# Patient Record
Sex: Male | Born: 1980 | Race: White | Hispanic: No | Marital: Single | Smoking: Current every day smoker
Health system: Southern US, Community
[De-identification: ages and names within clinical notes are randomized; demographics above are authoritative.]

## PROBLEM LIST (undated history)

## (undated) DIAGNOSIS — M109 Gout, unspecified: Secondary | ICD-10-CM

## (undated) DIAGNOSIS — I10 Essential (primary) hypertension: Secondary | ICD-10-CM

## (undated) DIAGNOSIS — N200 Calculus of kidney: Secondary | ICD-10-CM

---

## 2013-05-26 ENCOUNTER — Emergency Department: Payer: Self-pay | Admitting: Internal Medicine

## 2013-07-24 ENCOUNTER — Emergency Department: Payer: Self-pay | Admitting: Emergency Medicine

## 2013-07-24 LAB — URINALYSIS, COMPLETE
Bilirubin,UR: NEGATIVE
GLUCOSE, UR: NEGATIVE mg/dL (ref 0–75)
KETONE: NEGATIVE
Leukocyte Esterase: NEGATIVE
NITRITE: NEGATIVE
PH: 6 (ref 4.5–8.0)
Protein: NEGATIVE
RBC,UR: 13 /HPF (ref 0–5)
Specific Gravity: 1.012 (ref 1.003–1.030)
Squamous Epithelial: NONE SEEN

## 2013-07-24 LAB — CBC
HCT: 55.4 % — AB (ref 40.0–52.0)
HGB: 18.4 g/dL — ABNORMAL HIGH (ref 13.0–18.0)
MCH: 31.3 pg (ref 26.0–34.0)
MCHC: 33.2 g/dL (ref 32.0–36.0)
MCV: 95 fL (ref 80–100)
PLATELETS: 258 10*3/uL (ref 150–440)
RBC: 5.86 10*6/uL (ref 4.40–5.90)
RDW: 13.3 % (ref 11.5–14.5)
WBC: 14.4 10*3/uL — AB (ref 3.8–10.6)

## 2013-07-25 LAB — COMPREHENSIVE METABOLIC PANEL
ALBUMIN: 4.1 g/dL (ref 3.4–5.0)
ALK PHOS: 77 U/L
ALT: 34 U/L (ref 12–78)
AST: 18 U/L (ref 15–37)
Anion Gap: 4 — ABNORMAL LOW (ref 7–16)
BUN: 22 mg/dL — AB (ref 7–18)
Bilirubin,Total: 0.3 mg/dL (ref 0.2–1.0)
CALCIUM: 9.1 mg/dL (ref 8.5–10.1)
CREATININE: 1.28 mg/dL (ref 0.60–1.30)
Chloride: 104 mmol/L (ref 98–107)
Co2: 29 mmol/L (ref 21–32)
EGFR (African American): >60
EGFR (Non-African Amer.): >60
Glucose: 94 mg/dL (ref 65–99)
OSMOLALITY: 277 (ref 275–301)
Potassium: 4.4 mmol/L (ref 3.5–5.1)
SODIUM: 137 mmol/L (ref 136–145)
TOTAL PROTEIN: 7 g/dL (ref 6.4–8.2)

## 2014-03-29 ENCOUNTER — Emergency Department: Payer: Self-pay | Admitting: Student

## 2014-06-21 ENCOUNTER — Emergency Department: Payer: Self-pay

## 2014-06-21 ENCOUNTER — Emergency Department
Admission: EM | Admit: 2014-06-21 | Discharge: 2014-06-21 | Disposition: A | Discharge status: Home / Self Care | Payer: Self-pay | Attending: Emergency Medicine | Admitting: Emergency Medicine

## 2014-06-21 DIAGNOSIS — Z720 Tobacco use: Secondary | ICD-10-CM | POA: Insufficient documentation

## 2014-06-21 DIAGNOSIS — I10 Essential (primary) hypertension: Secondary | ICD-10-CM | POA: Insufficient documentation

## 2014-06-21 DIAGNOSIS — N200 Calculus of kidney: Secondary | ICD-10-CM | POA: Insufficient documentation

## 2014-06-21 HISTORY — DX: Calculus of kidney: N20.0

## 2014-06-21 HISTORY — DX: Essential (primary) hypertension: I10

## 2014-06-21 HISTORY — DX: Gout, unspecified: M10.9

## 2014-06-21 LAB — URINALYSIS COMPLETE WITH MICROSCOPIC (ARMC ONLY)
BACTERIA UA: NONE SEEN
Bilirubin Urine: NEGATIVE
Glucose, UA: NEGATIVE mg/dL
KETONES UR: NEGATIVE mg/dL
LEUKOCYTES UA: NEGATIVE
Nitrite: NEGATIVE
PROTEIN: NEGATIVE mg/dL
Specific Gravity, Urine: 1.008 (ref 1.005–1.030)
pH: 9 — ABNORMAL HIGH (ref 5.0–8.0)

## 2014-06-21 MED ORDER — TAMSULOSIN HCL 0.4 MG PO CAPS: 0.4000 mg | ORAL_CAPSULE | Freq: Every day | ORAL | Status: AC

## 2014-06-21 MED ORDER — OXYCODONE-ACETAMINOPHEN 5-325 MG PO TABS
2.0000 | ORAL_TABLET | Freq: Once | ORAL | Status: AC
  Administered 2014-06-21: 2 via ORAL

## 2014-06-21 MED ORDER — PROMETHAZINE HCL 25 MG/ML IJ SOLN
25.0000 mg | Freq: Once | INTRAMUSCULAR | Status: AC
  Administered 2014-06-21: 25 mg via INTRAMUSCULAR

## 2014-06-21 MED ORDER — IBUPROFEN 800 MG PO TABS: 800.0000 mg | ORAL_TABLET | Freq: Three times a day (TID) | ORAL | Status: AC | PRN

## 2014-06-21 MED ORDER — OXYCODONE-ACETAMINOPHEN 5-325 MG PO TABS: 1.0000 | ORAL_TABLET | ORAL | Status: DC | PRN

## 2014-06-21 MED ORDER — PROMETHAZINE HCL 25 MG PO TABS: 25.0000 mg | ORAL_TABLET | Freq: Four times a day (QID) | ORAL | Status: AC | PRN

## 2014-06-21 MED ORDER — MEPERIDINE HCL 50 MG/ML IJ SOLN
INTRAMUSCULAR | Status: AC
  Administered 2014-06-21: 50 mg via INTRAMUSCULAR
  Filled 2014-06-21: qty 1

## 2014-06-21 MED ORDER — OXYCODONE-ACETAMINOPHEN 5-325 MG PO TABS
ORAL_TABLET | ORAL | Status: AC
  Filled 2014-06-21: qty 2

## 2014-06-21 MED ORDER — PROMETHAZINE HCL 25 MG/ML IJ SOLN
INTRAMUSCULAR | Status: AC
  Administered 2014-06-21: 25 mg via INTRAMUSCULAR
  Filled 2014-06-21: qty 1

## 2014-06-21 MED ORDER — MEPERIDINE HCL 50 MG/ML IJ SOLN
50.0000 mg | Freq: Once | INTRAMUSCULAR | Status: AC
  Administered 2014-06-21: 50 mg via INTRAMUSCULAR

## 2014-06-21 NOTE — ED Notes (Signed)
C/o bilateral flank pain, right sided lower abdominal pain X 3 days. Dark urine present. Hx of kidney stones.

## 2014-06-21 NOTE — ED Provider Notes (Signed)
[facility] Emergency Department Provider Note  ____________________________________________  Time seen: Approximately 11:29 AM  I have reviewed the triage vital signs and the nursing notes.   HISTORY  Chief Complaint Back Pain   HPI Oscar Bridges is a 34 y.o. male sense of complaints of a history of bilateral lower back pain radiating to the right flank and abdomen. Progressively getting worse tonight dysuria but has noticed blood in his urine. Asked medical history significant for kidney stones. Rates pain as 11/10.  Past Medical History  Diagnosis Date  . Kidney stones   . Hypertension   . Gout     There are no active problems to display for this patient.   History reviewed. No pertinent past surgical history.  Current Outpatient Rx  Name  Route  Sig  Dispense  Refill  . ibuprofen (ADVIL,MOTRIN) 800 MG tablet   Oral   Take 1 tablet (800 mg total) by mouth every 8 (eight) hours as needed.   30 tablet   0   . oxyCODONE-acetaminophen (ROXICET) 5-325 MG per tablet   Oral   Take 1-2 tablets by mouth every 4 (four) hours as needed for severe pain.   30 tablet   0   . promethazine (PHENERGAN) 25 MG tablet   Oral   Take 1 tablet (25 mg total) by mouth every 6 (six) hours as needed for nausea or vomiting.   30 tablet   0   . tamsulosin (FLOMAX) 0.4 MG CAPS capsule   Oral   Take 1 capsule (0.4 mg total) by mouth daily.   30 capsule   0     Allergies Amoxicillin; Lithium; Morphine and related; Penicillins; Toradol; Tramadol; and Zoloft  No family history on file.  Social History History  Substance Use Topics  . Smoking status: Current Every Day Smoker  . Smokeless tobacco: Not on file  . Alcohol Use: No    Review of Systems Constitutional: No fever/chills Eyes: No visual changes. ENT: No sore throat. Cardiovascular: Denies chest pain. Respiratory: Denies shortness of breath. Gastrointestinal: No abdominal pain.  No nausea,  no vomiting.  No diarrhea.  No constipation. Genitourinary: Negative for dysuria. Positive for bloody urine. Musculoskeletal: Positive for bilateral low back pain. Skin: Negative for rash. Neurological: Negative for headaches, focal weakness or numbness.  10-point ROS otherwise negative.  ____________________________________________   PHYSICAL EXAM:  VITAL SIGNS: ED Triage Vitals  Enc Vitals Group     BP 06/21/14 1121 162/107 mmHg     Pulse Rate 06/21/14 1121 88     Resp 06/21/14 1121 20     Temp 06/21/14 1121 97.6 F (36.4 C)     Temp Source 06/21/14 1121 Oral     SpO2 06/21/14 1121 98 %     Weight 06/21/14 1121 230 lb (104.327 kg)     Height 06/21/14 1121 5\' 7"  (1.702 m)     Head Cir --      Peak Flow --      Pain Score --      Pain Loc --      Pain Edu? --      Excl. in GC? --     Constitutional: Alert and oriented. Well appearing and in no acute distress. Eyes: Conjunctivae are normal. PERRL. EOMI. Head: Atraumatic. Nose: No congestion/rhinnorhea. Mouth/Throat: Mucous membranes are moist.  Oropharynx non-erythematous. Neck: No stridor.   Cardiovascular: Normal rate, regular rhythm. Grossly normal heart sounds.  Good peripheral circulation. Respiratory: Normal respiratory effort.  No retractions. Lungs CTAB. Gastrointestinal: Soft and nontender. No distention. No abdominal bruits. No CVA tenderness. Genitourinary: Positive suprapubic pain negative discharge. Musculoskeletal: No lower extremity tenderness nor edema.  No joint effusions. Neurologic:  Normal speech and language. No gross focal neurologic deficits are appreciated. Speech is normal. No gait instability. Skin:  Skin is warm, dry and intact. No rash noted. Psychiatric: Mood and affect are normal. Speech and behavior are normal.  ____________________________________________   LABS (all labs ordered are listed, but only abnormal results are displayed)  Labs Reviewed  URINALYSIS COMPLETEWITH  MICROSCOPIC [facility])  - Abnormal; Notable for the following:    Color, Urine STRAW (*)    APPearance CLEAR (*)    Hgb urine dipstick 3+ (*)    pH 9.0 (*)    Squamous Epithelial / LPF 0-5 (*)    All other components within normal limits   ____________________________________________  EKG  none ____________________________________________  RADIOLOGY    IMPRESSION: BILATERAL nephrolithiasis. 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 Cholelithiasis.   Partially obstructing stone in the proximal to mid LEFT ureter, 4 mm in diameter. Mild hydronephrosis and hydroureter on the LEFT. This represents a change from the previous CT in 2015.   Otherwise unremarkable noncontrast study. _________________________________________   PROCEDURES  Procedure(s) performed: None  Critical Care performed: No  ____________________________________________   INITIAL IMPRESSION / ASSESSMENT AND PLAN / ED COURSE  Pertinent labs & imaging results that were available during my care of the patient were reviewed by me and considered in my medical decision making (see chart for details).  Spoke with Dr. Haynes Bast, Urology on call. He agrees with the discharge plan. Patient will follow up with [facility] urology and see Dr. Vanna Scotland. Rx given for Percocet, Flomax 0.4 mg, Phenergan 25 mg, ibuprofen 800. She was sent home with a copy of his CT scan. IV discharged patient feeling much better from an injection of Demerol and Phenergan which she received upon arrival. He voices no other emergency medical complaints at this time. He will return if symptoms worsen. ____________________________________________   FINAL CLINICAL IMPRESSION(S) / ED DIAGNOSES  Final diagnoses:  Kidney stones      Evangeline Dakin, PA-C 06/21/14 1312  Phineas Semen, MD 06/21/14 1348

## 2014-06-21 NOTE — Discharge Instructions (Signed)

## 2014-06-21 NOTE — ED Notes (Signed)
Pt alert and oriented X4, active, cooperative, pt in NAD. RR even and unlabored, color WNL.  Pt informed to return if any life threatening symptoms occur.  Pt left with friend who drove him here from work.

## 2014-07-04 ENCOUNTER — Emergency Department
Admission: EM | Admit: 2014-07-04 | Discharge: 2014-07-04 | Disposition: A | Discharge status: Home / Self Care | Payer: Self-pay | Attending: Emergency Medicine | Admitting: Emergency Medicine

## 2014-07-04 ENCOUNTER — Encounter: Payer: Self-pay | Admitting: Emergency Medicine

## 2014-07-04 ENCOUNTER — Emergency Department: Payer: Self-pay

## 2014-07-04 DIAGNOSIS — I10 Essential (primary) hypertension: Secondary | ICD-10-CM | POA: Insufficient documentation

## 2014-07-04 DIAGNOSIS — R109 Unspecified abdominal pain: Secondary | ICD-10-CM

## 2014-07-04 DIAGNOSIS — Z880 Allergy status to penicillin: Secondary | ICD-10-CM | POA: Insufficient documentation

## 2014-07-04 DIAGNOSIS — N200 Calculus of kidney: Secondary | ICD-10-CM | POA: Insufficient documentation

## 2014-07-04 DIAGNOSIS — Z720 Tobacco use: Secondary | ICD-10-CM | POA: Insufficient documentation

## 2014-07-04 DIAGNOSIS — Z79899 Other long term (current) drug therapy: Secondary | ICD-10-CM | POA: Insufficient documentation

## 2014-07-04 LAB — CBC
HEMATOCRIT: 46.2 % (ref 40.0–52.0)
Hemoglobin: 15.7 g/dL (ref 13.0–18.0)
MCH: 31.7 pg (ref 26.0–34.0)
MCHC: 34 g/dL (ref 32.0–36.0)
MCV: 93.4 fL (ref 80.0–100.0)
PLATELETS: 235 10*3/uL (ref 150–440)
RBC: 4.94 MIL/uL (ref 4.40–5.90)
RDW: 13 % (ref 11.5–14.5)
WBC: 9.9 10*3/uL (ref 3.8–10.6)

## 2014-07-04 LAB — URINALYSIS COMPLETE WITH MICROSCOPIC (ARMC ONLY)
BACTERIA UA: NONE SEEN
BILIRUBIN URINE: NEGATIVE
Glucose, UA: NEGATIVE mg/dL
Ketones, ur: NEGATIVE mg/dL
LEUKOCYTES UA: NEGATIVE
Nitrite: NEGATIVE
Protein, ur: NEGATIVE mg/dL
Specific Gravity, Urine: 1.006 (ref 1.005–1.030)
pH: 9 — ABNORMAL HIGH (ref 5.0–8.0)

## 2014-07-04 LAB — BASIC METABOLIC PANEL
Anion gap: 6 (ref 5–15)
BUN: 15 mg/dL (ref 6–20)
CALCIUM: 8.9 mg/dL (ref 8.9–10.3)
CHLORIDE: 109 mmol/L (ref 101–111)
CO2: 25 mmol/L (ref 22–32)
CREATININE: 0.94 mg/dL (ref 0.61–1.24)
Glucose, Bld: 101 mg/dL — ABNORMAL HIGH (ref 65–99)
Potassium: 4.1 mmol/L (ref 3.5–5.1)
SODIUM: 140 mmol/L (ref 135–145)

## 2014-07-04 MED ORDER — HYDROMORPHONE HCL 1 MG/ML IJ SOLN
INTRAMUSCULAR | Status: AC
  Administered 2014-07-04: 1 mg via INTRAVENOUS
  Filled 2014-07-04: qty 1

## 2014-07-04 MED ORDER — ONDANSETRON HCL 4 MG/2ML IJ SOLN
INTRAMUSCULAR | Status: AC
  Administered 2014-07-04: 4 mg via INTRAVENOUS
  Filled 2014-07-04: qty 2

## 2014-07-04 MED ORDER — SODIUM CHLORIDE 0.9 % IV BOLUS (SEPSIS)
1000.0000 mL | Freq: Once | INTRAVENOUS | Status: AC
  Administered 2014-07-04: 1000 mL via INTRAVENOUS

## 2014-07-04 MED ORDER — ONDANSETRON HCL 4 MG/2ML IJ SOLN
4.0000 mg | INTRAMUSCULAR | Status: AC
  Administered 2014-07-04: 4 mg via INTRAVENOUS

## 2014-07-04 MED ORDER — OXYCODONE-ACETAMINOPHEN 5-325 MG PO TABS: 1.0000 | ORAL_TABLET | Freq: Four times a day (QID) | ORAL | Status: AC | PRN

## 2014-07-04 MED ORDER — HYDROMORPHONE HCL 1 MG/ML IJ SOLN
1.0000 mg | INTRAMUSCULAR | Status: AC
  Administered 2014-07-04: 1 mg via INTRAVENOUS

## 2014-07-04 NOTE — ED Notes (Signed)
Patient transported to X-ray 

## 2014-07-04 NOTE — ED Provider Notes (Signed)
[facility] Emergency Department Provider Note ____________________________________________  Time seen: Approximately 9:02 AM  I have reviewed the triage vital signs and the nursing notes.   HISTORY  Chief Complaint Flank Pain    HPI Oscar Bridges is a 34 y.o. male history of previous kidney stones. Patient states that last night he noticed that he was having difficulty urinating, then developed blood in his urine, and is now having pain in both his left flank as well as his right. He went home with recent diagnosis of kidney stones and believes he passed a stone a couple of days ago which improved the pain on left side, but he still has achiness in the left lower quadrant. In addition, last night he developed severe right flank pain which she states feels like he is passing a kidney stone on the right now as well.  Patient states he has a long history of kidney stones. He last had lithotripsy performed around 6 months ago while in [place] at [facility]. States his pain last night came on suddenly. Severity is currently a 10 out of 10. Sharp pain mostly in the right flank. Denies pain in his groin or testicles. He denies any fevers or chills. He has vomited a couple of times due to "pain".   Past Medical History  Diagnosis Date  . Kidney stones   . Hypertension   . Gout     There are no active problems to display for this patient.   No past surgical history on file.  Current Outpatient Rx  Name  Route  Sig  Dispense  Refill  . ibuprofen (ADVIL,MOTRIN) 800 MG tablet   Oral   Take 1 tablet (800 mg total) by mouth every 8 (eight) hours as needed.   30 tablet   0   . oxyCODONE-acetaminophen (ROXICET) 5-325 MG per tablet   Oral   Take 1-2 tablets by mouth every 4 (four) hours as needed for severe pain.   30 tablet   0   . promethazine (PHENERGAN) 25 MG tablet   Oral   Take 1 tablet (25 mg total) by mouth every 6 (six) hours as needed for  nausea or vomiting.   30 tablet   0   . tamsulosin (FLOMAX) 0.4 MG CAPS capsule   Oral   Take 1 capsule (0.4 mg total) by mouth daily.   30 capsule   0     Allergies Amoxicillin; Lithium; Morphine and related; Penicillins; Toradol; Tramadol; and Zoloft  No family history on file.  Social History History  Substance Use Topics  . Smoking status: Current Every Day Smoker  . Smokeless tobacco: Not on file  . Alcohol Use: No    Review of Systems Constitutional: No fever/chills Eyes: No visual changes. ENT: No sore throat. Cardiovascular: Denies chest pain. Respiratory: Denies shortness of breath. Gastrointestinal: See history of present illness No diarrhea.  No constipation. Genitourinary: Negative for dysuria. Does have some hesitancy with attempting to urinate, also noticing blood in his urine for the last day. Musculoskeletal: Negative for back pain. Skin: Negative for rash. Neurological: Negative for headaches, focal weakness or numbness.  10-point ROS otherwise negative.  ____________________________________________   PHYSICAL EXAM:  VITAL SIGNS: ED Triage Vitals  Enc Vitals Group     BP 07/04/14 0837 130/112 mmHg     Pulse Rate 07/04/14 0837 84     Resp 07/04/14 0837 20     Temp 07/04/14 0837 98.2 F (36.8 C)  Temp Source 07/04/14 0837 Oral     SpO2 07/04/14 0837 95 %     Weight 07/04/14 0837 230 lb (104.327 kg)     Height 07/04/14 0837  (1.702 m)     Head Cir --      Peak Flow --      Pain Score 07/04/14 0838 10     Pain Loc --      Pain Edu? --      Excl. in GC? --     Constitutional: Alert and oriented. Well developed, but appears in acute pain laying on the bed unable to comfortable. Eyes: Conjunctivae are normal. PERRL. EOMI. Head: Atraumatic. Nose: No congestion/rhinnorhea. Mouth/Throat: Mucous membranes are moist.  Oropharynx non-erythematous. Neck: No stridor.   Cardiovascular: Normal rate, regular rhythm. Grossly normal heart  sounds.  Good peripheral circulation. Respiratory: Normal respiratory effort.  No retractions. Lungs CTAB. Gastrointestinal: Soft and nontender with exception of mild tenderness in the right flank and right-sided CVA tenderness. There is no pain at McBurney's point. There is no pain with psoas. No distention. No abdominal bruits. Mild tenderness of the left CVA as well.  No groin pain or masses. Bilateral nontender descended testicles. Musculoskeletal: No lower extremity tenderness nor edema.  No joint effusions. Neurologic:  Normal speech and language. No gross focal neurologic deficits are appreciated. Speech is normal.  Skin:  Skin is warm, dry and intact. No rash noted. Psychiatric: Mood and affect are normal. Speech and behavior are normal.  ____________________________________________   LABS (all labs ordered are listed, but only abnormal results are displayed)  Labs Reviewed  CBC  BASIC METABOLIC PANEL  URINALYSIS COMPLETEWITH MICROSCOPIC ([facility] ONLY)   ____________________________________________  EKG   ____________________________________________  RADIOLOGY  Bladder:  Appears normal for degree of bladder distention. Bilateral ureteral jets were visualized.  IMPRESSION: 1. Bilateral nonobstructing nephrolithiasis. 2. 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   Electronically Signed By: Sebastian Ache On: 07/04/2014 10:01          DG Abd 1 View (Final result) Result time: 07/04/14 09:25:11   Final result by Rad Results In Interface (07/04/14 09:25:11)   Narrative:   CLINICAL DATA: Back pain and right-sided abdominal pain. History of prior renal calculi and lithotripsy.  EXAM: ABDOMEN - 1 VIEW  COMPARISON: CT of the abdomen and pelvis without contrast on 06/21/2014  FINDINGS: Some of the small calculi visualized by CT in the left kidney are identified by x-ray and show similar positioning. Small nonobstructing calculi in the right  kidney seen by CT are not visible. No definite abnormal calcifications seen along the expected courses of the ureters. No abnormal calcifications are seen overlying the bladder. No evidence of bowel obstruction. Bony structures are within normal limits.  IMPRESSION: Small left renal calculi visible by x-ray. Small right renal calculi seen previously by CT are not visible by x-ray. No definite calcifications seen along the expected course of the ureters or overlying the bladder.    ____________________________________________   PROCEDURES  Procedure(s) performed: None  Critical Care performed: No  ____________________________________________   INITIAL IMPRESSION / ASSESSMENT AND PLAN / ED COURSE  Pertinent labs & imaging results that were available during my care of the patient were reviewed by me and considered in my medical decision making (see chart for details).  Patient presenting with bilateral flank pain, now worse on the right with a history of recent kidney stone and ureteral lithiasis on the left. At this point based  on his symptoms my primary concern would be waiting for bilateral hydronephrosis. The patient may be passing bilateral kidney stones at this point, though based on his symptoms is possible that his left-sided kidney stone did resolve previously. He is afebrile and demonstrates no focal tenderness in the right lower quadrant. He has no Murphy sign. He has no pain at McBurney's point.  We will proceed with x-ray and ultrasound imaging to evaluate for bilateral stones and/or hydronephrosis. Pain control and antiemetics. Hydration.  ----------------------------------------- 12:15 PM on 07/04/2014 -----------------------------------------  Patient appears improved. He is currently resting comfortably. No distress. I reviewed ultrasound and x-ray, as well as laboratory analysis with Dr.Dahlstead the on-call physician for urology. At this point, there is no  indication of bilateral obstructing Hydro. This on the patient's symptoms and history it appears that this is most compatible with probably passing a another stone, too small to be seen on KUB in the right. He does not have any evidence of acute infection or pain at McBurney's point. I doubt appendicitis.  We will discharge the patient to follow-up. Dr. Janifer Adieahlstead noted that Dr. [facility]ana [facility] clinic should be able to provide follow-up with the patient tomorrow if the patient calls to set up an appointment.  Because the patient has had 2 recent visits to the ER, I will give him a one-day prescription for pain control. He currently does not have any opiod medicine and does not take any benzodiazepine's. He will have his coworker drive him home. I discussed with him no driving and safe use of Percocet.  Discharge to home. Condition much improved. ____________________________________________   FINAL CLINICAL IMPRESSION(S) / ED DIAGNOSES  Final diagnoses:  Left flank pain   nephrolithiasis, right, initial, acute    Sharyn CreamerMark Courtany Mcmurphy, MD 07/04/14 1221

## 2014-07-04 NOTE — Discharge Instructions (Signed)
Kidney Stones  Please call Dr. Delana [facility]Br[facility]'s clinic today. Follow-up with them tomorrow. I spoken with our on-call urologist, Dr.Dahlbert, who advised that he should be able to be seen in the clinic tomorrow.  No driving. Use Percocet only as prescribed. Do not mix with any alcohol or other strong pain medicines.  Kidney stones (urolithiasis) are deposits that form inside your kidneys. The intense pain is caused by the stone moving through the urinary tract. When the stone moves, the ureter goes into spasm around the stone. The stone is usually passed in the urine.  CAUSES   A disorder that makes certain neck glands produce too much parathyroid hormone (primary hyperparathyroidism).  A buildup of uric acid crystals, similar to gout in your joints.  Narrowing (stricture) of the ureter.  A kidney obstruction present at birth (congenital obstruction).  Previous surgery on the kidney or ureters.  Numerous kidney infections. SYMPTOMS   Feeling sick to your stomach (nauseous).  Throwing up (vomiting).  Blood in the urine (hematuria).  Pain that usually spreads (radiates) to the groin.  Frequency or urgency of urination. DIAGNOSIS   Taking a history and physical exam.  Blood or urine tests.  CT scan.  Occasionally, an examination of the inside of the urinary bladder (cystoscopy) is performed. TREATMENT   Observation.  Increasing your fluid intake.  Extracorporeal shock wave lithotripsy--This is a noninvasive procedure that uses shock waves to break up kidney stones.  Surgery may be needed if you have severe pain or persistent obstruction. There are various surgical procedures. Most of the procedures are performed with the use of small instruments. Only small incisions are needed to accommodate these instruments, so recovery time is minimized. The size, location, and chemical composition are all important variables that will determine the proper choice of action for you. Talk to  your health care provider to better understand your situation so that you will minimize the risk of injury to yourself and your kidney.  HOME CARE INSTRUCTIONS   Drink enough water and fluids to keep your urine clear or pale yellow. This will help you to pass the stone or stone fragments.  Strain all urine through the provided strainer. Keep all particulate matter and stones for your health care provider to see. The stone causing the pain may be as small as a grain of salt. It is very important to use the strainer each and every time you pass your urine. The collection of your stone will allow your health care provider to analyze it and verify that a stone has actually passed. The stone analysis will often identify what you can do to reduce the incidence of recurrences.  Only take over-the-counter or prescription medicines for pain, discomfort, or fever as directed by your health care provider.  Make a follow-up appointment with your health care provider as directed.  Get follow-up X-rays if required. The absence of pain does not always mean that the stone has passed. It may have only stopped moving. If the urine remains completely obstructed, it can cause loss of kidney function or even complete destruction of the kidney. It is your responsibility to make sure X-rays and follow-ups are completed. Ultrasounds of the kidney can show blockages and the status of the kidney. Ultrasounds are not associated with any radiation and can be performed easily in a matter of minutes. SEEK MEDICAL CARE IF:  You experience pain that is progressive and unresponsive to any pain medicine you have been prescribed. SEEK IMMEDIATE MEDICAL CARE IF:  Pain cannot be controlled with the prescribed medicine.  You have a fever or shaking chills.  The severity or intensity of pain increases over 18 hours and is not relieved by pain medicine.  You develop a new onset of abdominal pain.  You feel faint or pass  out.  You are unable to urinate. MAKE SURE YOU:   Understand these instructions.  Will watch your condition.  Will get help right away if you are not doing well or get worse. Document Released: 01/14/2005 Document Revised: 09/16/2012 Document Reviewed: 06/17/2012 [facility] Patient Information 2015 [place], [place]. This information is not intended to replace advice given to you by your health care provider. Make sure you discuss any questions you have with your health care provider.

## 2014-07-04 NOTE — ED Notes (Signed)
Patient reports peeing "straight blood" a couple of nights ago.

## 2016-08-07 IMAGING — CR DG ABDOMEN 1V
1 series · 2 of 2 positions shown · non-contrast
Comparison: CT of the abdomen and pelvis without contrast on
06/21/2014

CLINICAL DATA: Back pain and right-sided abdominal pain. History of
prior renal calculi and lithotripsy.

EXAM:
ABDOMEN - 1 VIEW

[Series 1: dg abd 1 view · 0.14mm/px · 2 of 2 slices shown]
[im 1/2]
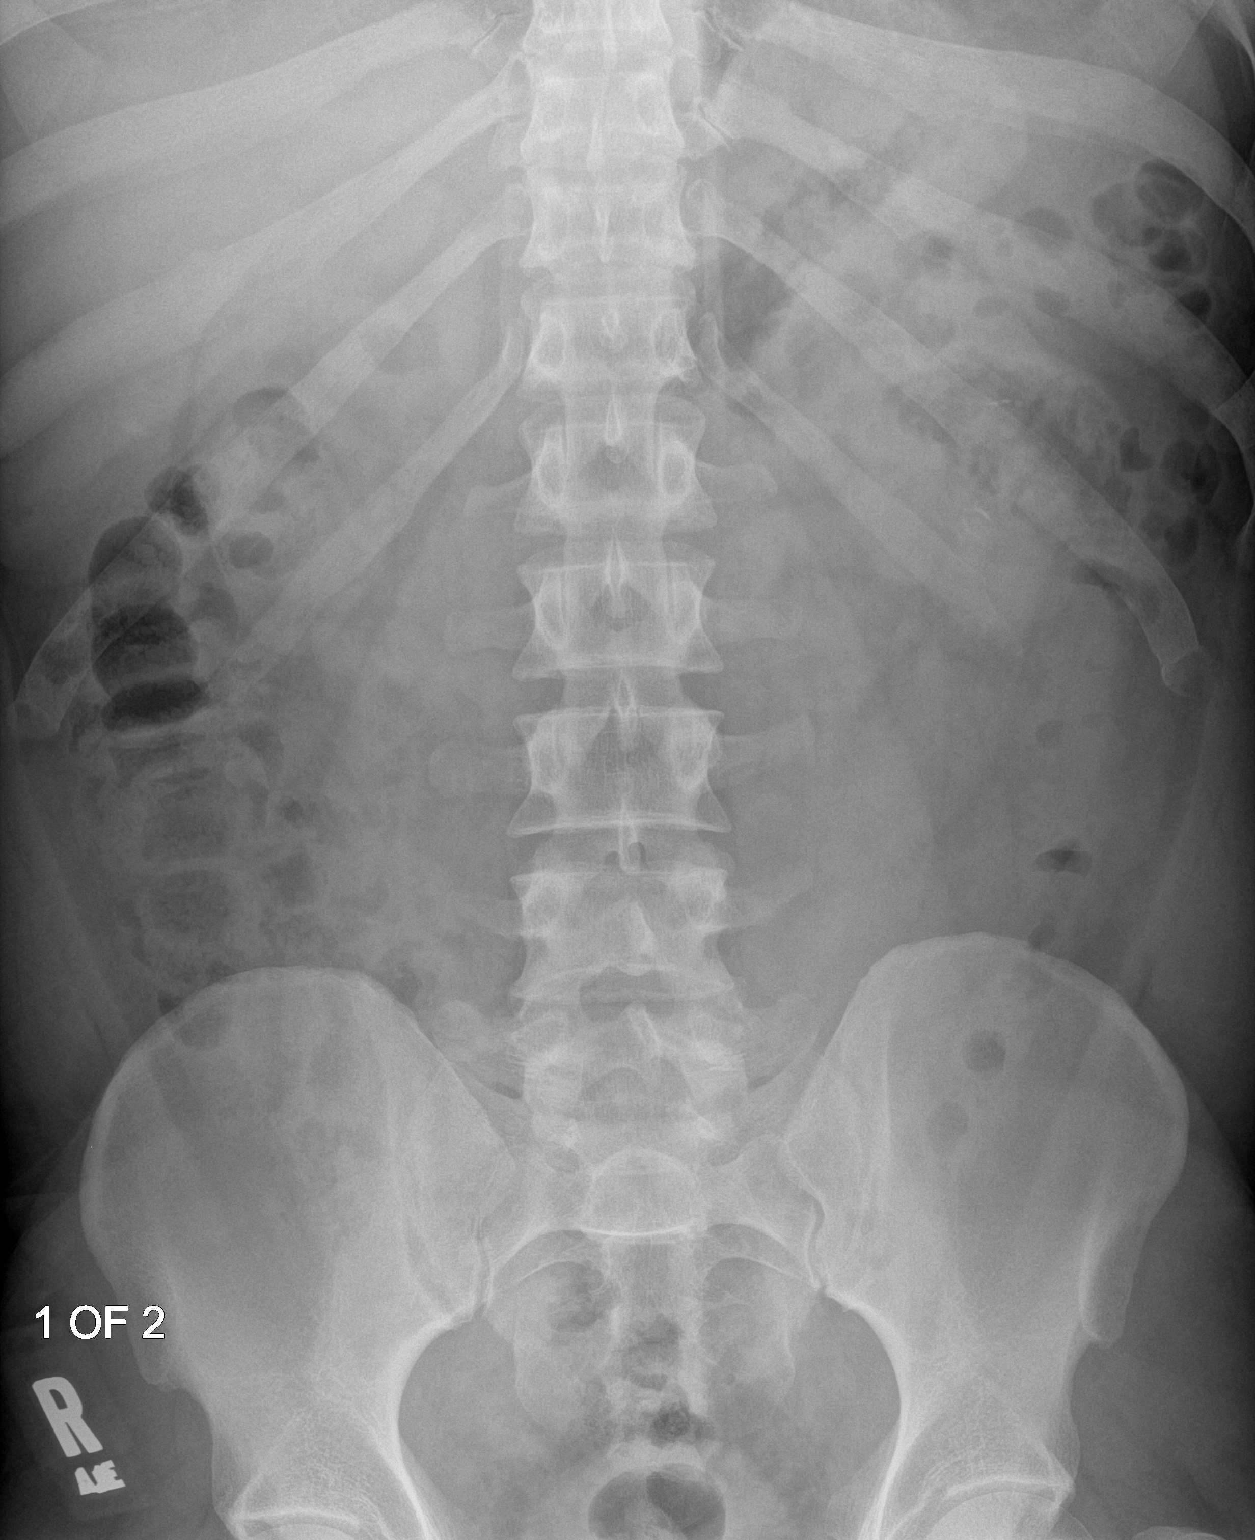
[im 2/2]
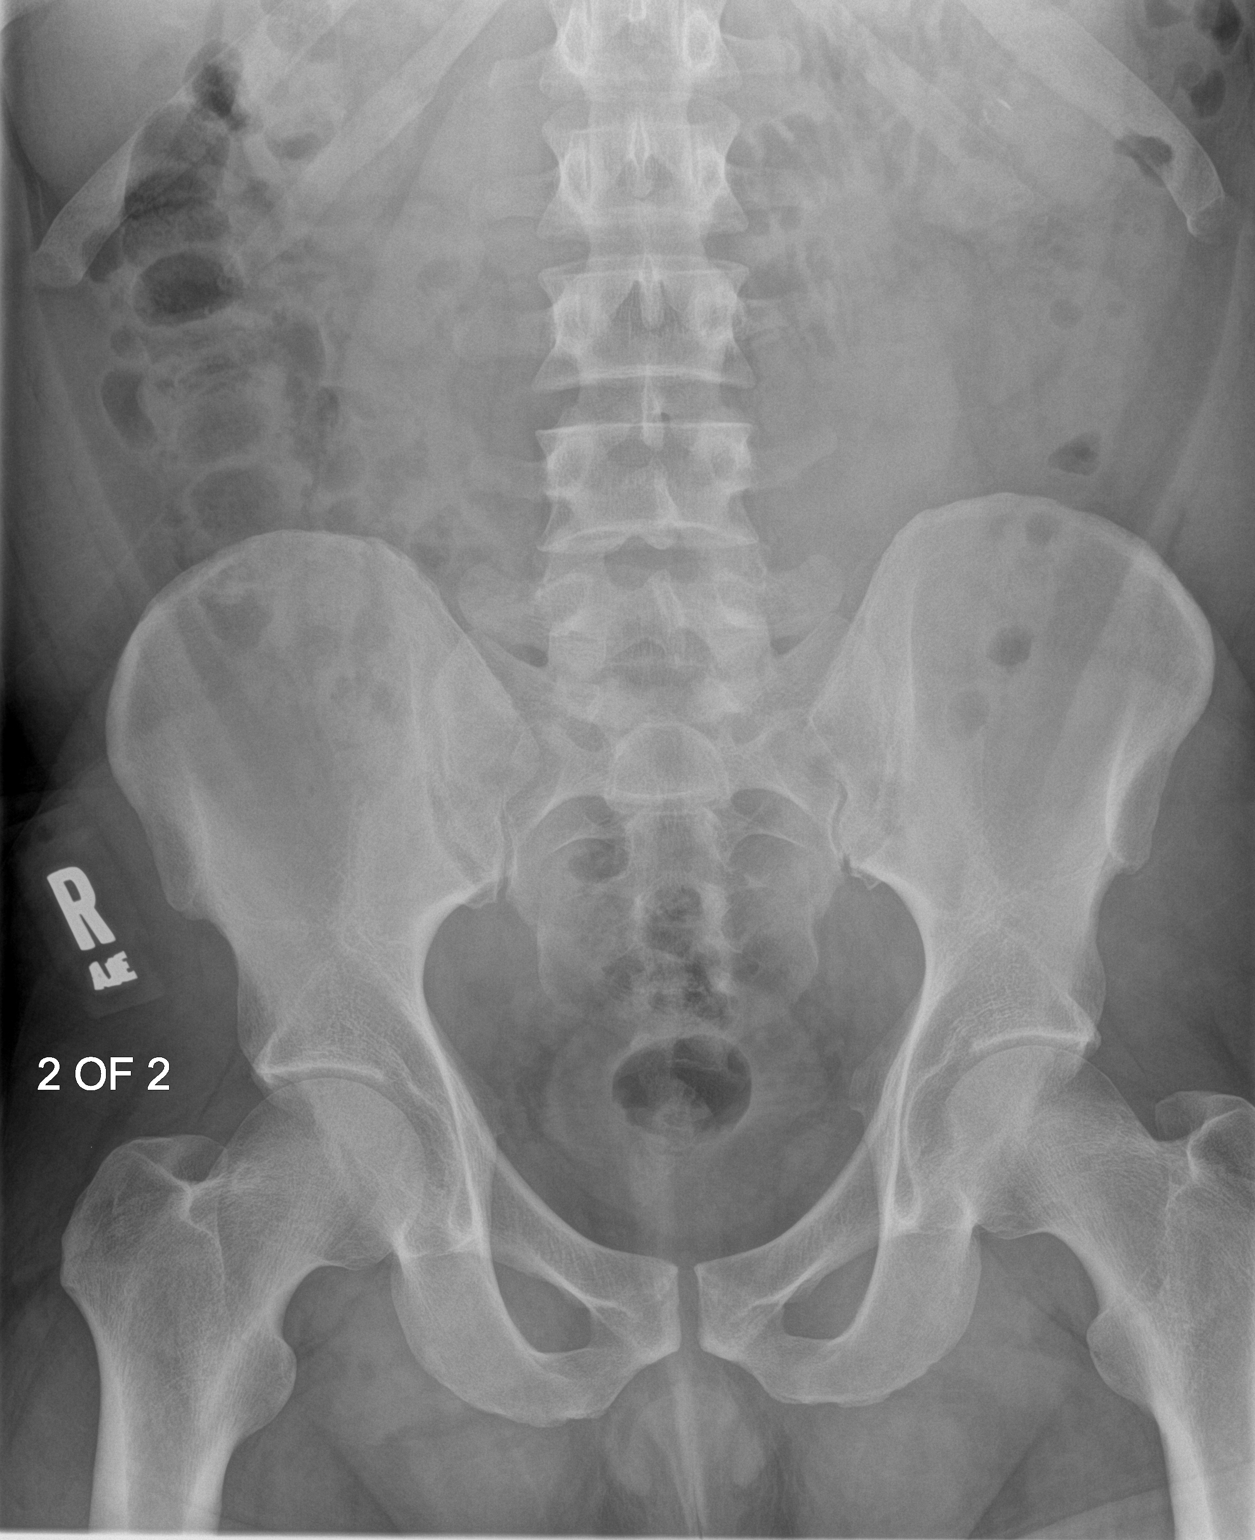

[2 of 2 positions shown; findings below may reference images not displayed]

FINDINGS: Some of the small calculi visualized by CT in the left kidney are
identified by x-ray and show similar positioning. Small
nonobstructing calculi in the right kidney seen by CT are not
visible. No definite abnormal calcifications seen along the expected
courses of the ureters. No abnormal calcifications are seen
overlying the bladder. No evidence of bowel obstruction. Bony
structures are within normal limits.
IMPRESSION: Small left renal calculi visible by x-ray. Small right renal calculi
seen previously by CT are not visible by x-ray. No definite
calcifications seen along the expected course of the ureters or
overlying the bladder.
